# Patient Record
Sex: Male | Born: 1965 | Hispanic: Yes | Marital: Single | Smoking: Current every day smoker
Health system: Southern US, Community
[De-identification: ages and names within clinical notes are randomized; demographics above are authoritative.]

---

## 2014-05-15 ENCOUNTER — Encounter (HOSPITAL_COMMUNITY): Payer: Self-pay | Admitting: Emergency Medicine

## 2014-05-15 ENCOUNTER — Emergency Department (HOSPITAL_COMMUNITY): Payer: Worker's Compensation

## 2014-05-15 ENCOUNTER — Emergency Department (HOSPITAL_COMMUNITY)
Admission: EM | Admit: 2014-05-15 | Discharge: 2014-05-15 | Disposition: A | Discharge status: Home / Self Care | Payer: Worker's Compensation | Attending: Emergency Medicine | Admitting: Emergency Medicine

## 2014-05-15 DIAGNOSIS — S61209A Unspecified open wound of unspecified finger without damage to nail, initial encounter: Secondary | ICD-10-CM | POA: Insufficient documentation

## 2014-05-15 DIAGNOSIS — S6990XA Unspecified injury of unspecified wrist, hand and finger(s), initial encounter: Secondary | ICD-10-CM | POA: Insufficient documentation

## 2014-05-15 DIAGNOSIS — F17200 Nicotine dependence, unspecified, uncomplicated: Secondary | ICD-10-CM | POA: Diagnosis not present

## 2014-05-15 DIAGNOSIS — S6980XA Other specified injuries of unspecified wrist, hand and finger(s), initial encounter: Secondary | ICD-10-CM | POA: Diagnosis present

## 2014-05-15 DIAGNOSIS — Y990 Civilian activity done for income or pay: Secondary | ICD-10-CM | POA: Diagnosis not present

## 2014-05-15 DIAGNOSIS — Y9389 Activity, other specified: Secondary | ICD-10-CM | POA: Insufficient documentation

## 2014-05-15 DIAGNOSIS — Y9289 Other specified places as the place of occurrence of the external cause: Secondary | ICD-10-CM | POA: Diagnosis not present

## 2014-05-15 DIAGNOSIS — S61219A Laceration without foreign body of unspecified finger without damage to nail, initial encounter: Secondary | ICD-10-CM

## 2014-05-15 DIAGNOSIS — W268XXA Contact with other sharp object(s), not elsewhere classified, initial encounter: Secondary | ICD-10-CM | POA: Diagnosis not present

## 2014-05-15 MED ORDER — LIDOCAINE HCL (PF) 1 % IJ SOLN
10.0000 mL | Freq: Once | INTRAMUSCULAR | Status: AC
  Administered 2014-05-15: 10 mL via INTRADERMAL
  Filled 2014-05-15: qty 10

## 2014-05-15 MED ORDER — TETANUS-DIPHTH-ACELL PERTUSSIS 5-2.5-18.5 LF-MCG/0.5 IM SUSP
0.5000 mL | Freq: Once | INTRAMUSCULAR | Status: AC
Start: 2014-05-15 — End: 2014-05-15
  Administered 2014-05-15: 0.5 mL via INTRAMUSCULAR
  Filled 2014-05-15: qty 0.5

## 2014-05-15 MED ORDER — CEPHALEXIN 500 MG PO CAPS: 500.0000 mg | ORAL_CAPSULE | Freq: Two times a day (BID) | ORAL | Status: AC

## 2014-05-15 MED ORDER — HYDROCODONE-ACETAMINOPHEN 5-325 MG PO TABS: 1.0000 | ORAL_TABLET | Freq: Four times a day (QID) | ORAL | Status: AC | PRN

## 2014-05-15 MED ORDER — OXYCODONE-ACETAMINOPHEN 5-325 MG PO TABS
1.0000 | ORAL_TABLET | Freq: Once | ORAL | Status: AC
  Administered 2014-05-15: 1 via ORAL
  Filled 2014-05-15: qty 1

## 2014-05-15 MED ORDER — IBUPROFEN 600 MG PO TABS: 600.0000 mg | ORAL_TABLET | Freq: Four times a day (QID) | ORAL | Status: AC | PRN

## 2014-05-15 NOTE — ED Notes (Signed)
Cu t 3 fingers left hand w/ saw blade bleeding controlled at this time

## 2014-05-15 NOTE — ED Provider Notes (Signed)
Medical screening examination/treatment/procedure(s) were performed by non-physician practitioner and as supervising physician I was immediately available for consultation/collaboration.  Audree Camel, MD 05/15/14 [id number]

## 2014-05-15 NOTE — ED Notes (Signed)
Instructions given to pt. And pt.s boss.  Pt.verbalized understanding of instructions. They were printed in Spanish

## 2014-05-15 NOTE — ED Provider Notes (Signed)
CSN: [id number]     Arrival date & time 05/15/14  0848 History  This chart was scribed for non-physician practitioner, Jaynie Crumble, PA-C working with Audree Camel, MD by Greggory Stallion, ED scribe. This patient was seen in room TR06C/TR06C and the patient's care was started at 9:36 AM.    Chief Complaint  Patient presents with  . Hand Pain   The history is provided by the patient. No language interpreter was used.   HPI Comments: Timothy Espinoza is a 48 y.o. male who presents to the Emergency Department complaining of left hand laceration that occurred about one hour ago. Pt states he accidentally cut his index, middle and ring fingers with a saw blade at work. 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 Pt's last tetanus was 8 years ago.   History reviewed. No pertinent past medical history. History reviewed. No pertinent past surgical history. No family history on file. History  Substance Use Topics  . Smoking status: Current Every Day Smoker  . Smokeless tobacco: Not on file  . Alcohol Use: Yes    Review of Systems  Skin: Positive for wound.  All other systems reviewed and are negative.  Allergies  Review of patient's allergies indicates no known allergies.  Home Medications   Prior to Admission medications   Not on File   BP 124/80  Pulse 52  Temp(Src) 97.8 F (36.6 C)  Resp 16  SpO2 97%  Physical Exam  Nursing note and vitals reviewed. Constitutional: He is oriented to person, place, and time. He appears well-developed and well-nourished. No distress.  HENT:  Head: Normocephalic and atraumatic.  Eyes: Conjunctivae and EOM are normal.  Neck: Neck supple. No tracheal deviation present.  Cardiovascular: Normal rate.   Pulmonary/Chest: Effort normal. No respiratory distress.  Musculoskeletal: Normal range of motion.  Full range of motion of all fingers at every joint, strength is intact with flexion and extension at every joint against resistance. Cap  refill is in 2 seconds distally in all fingertips. Sensation is intact in all finger tips.  Neurological: He is alert and oriented to person, place, and time.  Skin: Skin is warm and dry.  Lacerations to the fingertips of the second, third, fourth fingers of the left hand. Laceration to the second and fourth fingers both measuring 2 cm, non-gaping. Laceration to the third finger is a flap laceration through the pad of the distal phalanx, hemostatic, measuring 3 cm  Psychiatric: He has a normal mood and affect. His behavior is normal.    ED Course  Procedures (including critical care time)  LACERATION REPAIR PROCEDURE NOTE The patient's identification was confirmed and consent was obtained. This procedure was performed by Jaynie Crumble, PA-C at 10:36 AM. Site: left index finger Sterile procedures observed Anesthetic used (type and amt): 1.5 mL 1% lidocaine without epi Suture type/size: 5-0 Prolene Length: 1 cm # of Sutures: 4 Technique: simple interrupted Complexity: simple Antibx ointment applied Tetanus ordered Site anesthetized, irrigated with NS, explored without evidence of foreign body, wound well approximated, site covered with dry, sterile dressing.  Patient tolerated procedure well without complications. Instructions for care discussed verbally and patient provided with additional written instructions for homecare and f/u.  LACERATION REPAIR PROCEDURE NOTE The patient's identification was confirmed and consent was obtained. This procedure was performed by Jaynie Crumble, PA-C at 10:36 AM. Site: left middle finger Sterile procedures observed Anesthetic used (type and amt): 2 mL 1% lidocaine without epi Suture type/size: 5-0 Prolene Length:  3 cm # of Sutures:7 Technique: simple interrupted Complexity: simple Antibx ointment applied Tetanus ordered Site anesthetized, irrigated with NS, explored without evidence of foreign body, wound well approximated, site  covered with dry, sterile dressing.  Patient tolerated procedure well without complications. Instructions for care discussed verbally and patient provided with additional written instructions for homecare and f/u.  LACERATION REPAIR PROCEDURE NOTE The patient's identification was confirmed and consent was obtained. This procedure was performed by Jaynie Crumble, PA-C at 10:36 AM. Site: left ring finger Sterile procedures observed Anesthetic used (type and amt): 1.5 mL 1% lidocaine without epi Suture type/size: 5-0 Prolene Length: 1 cm # of Sutures: 2 Technique: simple interrupted Complexity: simple Antibx ointment applied Tetanus ordered Site anesthetized, irrigated with NS, explored without evidence of foreign body, wound well approximated, site covered with dry, sterile dressing.  Patient tolerated procedure well without complications. Instructions for care discussed verbally and patient provided with additional written instructions for homecare and f/u.   DIAGNOSTIC STUDIES: Oxygen Saturation is 97% on RA, normal by my interpretation.    COORDINATION OF CARE: 9:38 AM-Advised pt of xray results. Discussed treatment plan which includes updating tetanus and laceration repair with pt at bedside and pt agreed to plan.   Labs Review Labs Reviewed - No data to display  Imaging Review Dg Hand Complete Left  05/15/2014   CLINICAL DATA:  Saw injury.  EXAM: LEFT HAND - COMPLETE 3+ VIEW  COMPARISON:  None.  FINDINGS: Limited exam due to overlying bandage. Soft tissue injury noted along the distal aspects of the second ,third ,and fourth digits. No radiopaque foreign bodies. No evidence of acute fracture or dislocation. Old clavicular fracture is noted. Mild sclerosis noted of the fragment along the medial aspect. This could be secondary to avascular necrosis.  IMPRESSION: 1. Soft tissue injury second ,third and, fourth distal tuft. No underlying acute bony abnormality. 2. 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   Electronically Signed   By: Maisie Fus  Register   On: 05/15/2014 10:17     EKG Interpretation None      MDM   Final diagnoses:  Laceration of fingers without complication, initial encounter    Patient with injury to the distal fingertips of fingers 2 through 4 with a soft. X-ray obtained and is negative. Laceration cleaned thoroughly and repaired with sutures. Will discharge home on Keflex, pain medication, followup in 7 days for suture removal. No evidence of tendon or nerve or vascular injury based on the exam.  Filed Vitals:   05/15/14 0858  BP: 124/80  Pulse: 52  Temp: 97.8 F (36.6 C)  Resp: 16  SpO2: 97%     I personally performed the services described in this documentation, which was scribed in my presence. The recorded information has been reviewed and is accurate.  Lottie Mussel, PA-C 05/15/14 1637

## 2014-05-15 NOTE — Discharge Instructions (Signed)
Ibuprofen for pain. Norco for severe pain only. Take Keflex as prescribed to prevent infection. Keep fingers clean, apply topical antibiotic ointment twice a day. Followup in 7 days with either your Dr. or return here for suture removal.  Cuidados de una laceracin - Adultos  (Laceration Care, Adult)  Una herida cortante es un corte o lesin que atraviesa todas las capas de la piel y el tejido que se encuentra debajo de la piel.  TRATAMIENTO  Algunas laceraciones no requieren sutura. Algunas no deben cerrarse debido a que puede aumentar el riesgo de infeccin. Es importante que consulte al mdico lo antes posible despus de recibir una lesin para minimizar el riesgo de infeccin y aumentar la posibilidad de que se cierre con xito.  Cuando se cierra adecuadamente, podrn indicarle analgsicos, si los necesita. La herida debe limpiarse para combatir la infeccin. El mdico usar puntos (suturas), [place], o tiras [place] para Environmental consultant. Estos elementos mantendrn unidos los bordes de la piel para que se cure ms rpidamente y para un mejor resultado cosmtico. Sin embargo, todas las heridas se curarn con una cicatriz. Una vez que la herida se haya curado, las cicatrices pueden minimizarse cubriendo la herida con pantalla solar durante el da por un lapso se 1 ao.  INSTRUCCIONES PARA EL CUIDADO EN EL HOGAR  Si tiene puntos o grapas:   Mantenga la herida limpia y [place].  Si tiene un (vendaje) cmbielo al menos una vez al da. Cmbielo si se moja o se ensucia, o segn las indicaciones del mdico.  Lave el corte dos veces por da con agua y [place]. Enjuguelo con agua para quitar todo el [place]. Seque dando palmaditas con una toalla limpia y seca.  Despus de limpiar, aplique una delgada capa de una crema con antibitico segn las indicaciones del mdico. Esto le ayudar a prevenir las infecciones y a Automotive engineer que el vendaje se Building services engineer.  Puede ducharse despus de las primeras 24  horas. No remoje la herida en agua hasta que le hayan quitado los puntos.  Solo tome medicamentos que se pueden comprar sin receta o recetados para Chief Technology Officer, Dentist o fiebre, como le indica el mdico.  Concurra para que le retiren los puntos o las grapas cuando el mdico le indique. En caso que tenga tiras ZOXWRUEAV:   Mantenga la herida limpia y seca.  No deje que las tiras se mojen. Puede darse un bao cuidando de [employer] herida seca.  Si se moja, squela dando palmaditas con una toalla limpia.  Las tiras caern por s mismas. Puede recortar las tiras a medida que la herida se [place]. No quite las tiras que estn pegadas a la herida. Ellas se caern cuando sea el momento. En caso que le hayan [place].   Podr mojara momentneamente la herida en la ducha o el bao. No frote ni sumerja la herida. No practique natacin. Evite transpirar con abundancia hasta que el [place] se haya cado. Despus de ducharse o darse un bao, seque el corte dando palmaditas con una toalla limpia.  No aplique medicamentos lquidos, en crema o ungentos mientras el [employer] est en su lugar. Podr aflojarlo antes de que la herida se cure.  Si tiene un vendaje, tenga cuidado de no aplicar cinta adhesiva directamente [employer]. Esto puede hacer que el [place] se caiga antes de que la herida se haya curado.  Evite la exposicin prolongada a la luz del sol o a la International aid/development worker en [place] se encuentre  en el lugar. La exposicin a los Hydrographic surveyor la Training and development officer.  El [employer] la piel durante 5 a 10 das y Therapist, occupational. No quite la pelcula de [place]. Deber aplicarse la vacuna contra el ttanos si:  No recuerda cundo se coloc la vacuna la ltima vez.  Nunca recibi esta vacuna. Si le han aplicado la vacuna contra el ttanos, el brazo podr hincharse, enrojecer y sentirse caliente al tacto. Esto es frecuente y no  es un problema. Si usted necesita aplicarse la vacuna y se niega a recibirla, corre riesgo de contraer ttanos. sta es una enfermedad grave.  SOLICITE ATENCIN MDICA SI:   Presenta enrojecimiento, hinchazn o aumento del dolor en la herida.  Hay rayas rojas que salen de la herida.  Observa un lquido blanco amarillento (pus) en la herida.  Tiene fiebre.  Advierte un olor ftido que proviene de la herida o del vendaje.  La herida se abre luego de que le han extrado las suturas.  Nota que en la herida hay algn cuerpo extrao como un trozo de [place] o vidrio.  La herida est en su mano o pie y observa que no puede mover correctamente los dedos. SOLICITE ATENCIN MDICA DE INMEDIATO SI:   El dolor no se alivia con los [employer].  Hay una zona muy hinchada alrededor de la herida que le causa dolor y adormecimiento, o advierte un cambio en el color en el brazo, la mano, la pierna o el pie.  La herida se abre y sangra nuevamente.  Siente que el adormecimiento, la debilidad o la prdida de la funcin de la articulacin que rodea la herida [place].  Palpa ndulos dolorosos cerca de la herida o bajo la piel en cualquier zona del cuerpo. ASEGRESE DE QUE:   Comprende estas instrucciones.  Controlar su enfermedad.  Solicitar ayuda de inmediato si no mejora o si empeora. Document Released: 08/11/2005 Document Revised: 11/03/2011 [facility] Patient Information 2015 [place], [place]. This information is not intended to replace advice given to you by your health care provider. Make sure you discuss any questions you have with your health care provider.

## 2015-10-24 IMAGING — CR DG HAND COMPLETE 3+V*L*
4 series · 4 of 4 positions shown · non-contrast
Comparison: None.

CLINICAL DATA: Saw injury.

EXAM:
LEFT HAND - COMPLETE 3+ VIEW

[x hand pa left]
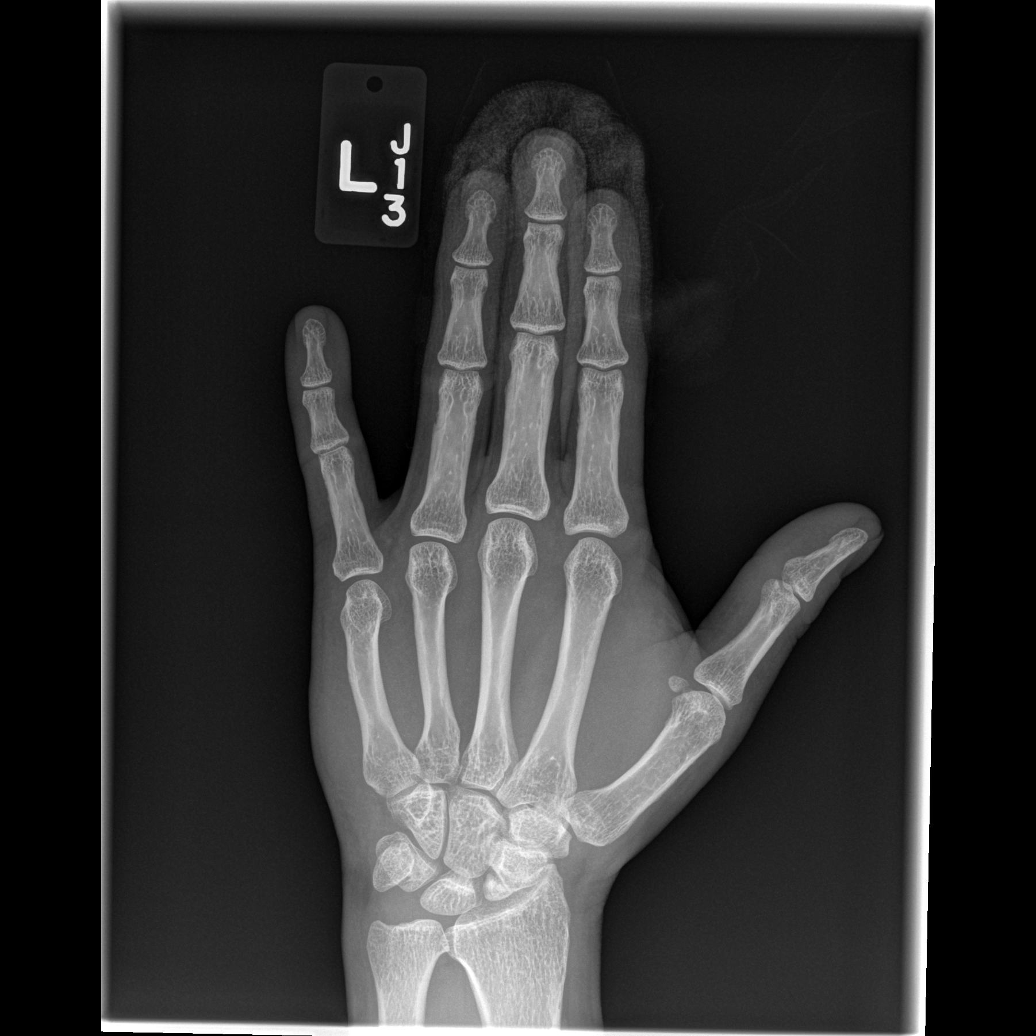

[x hand oblique left]
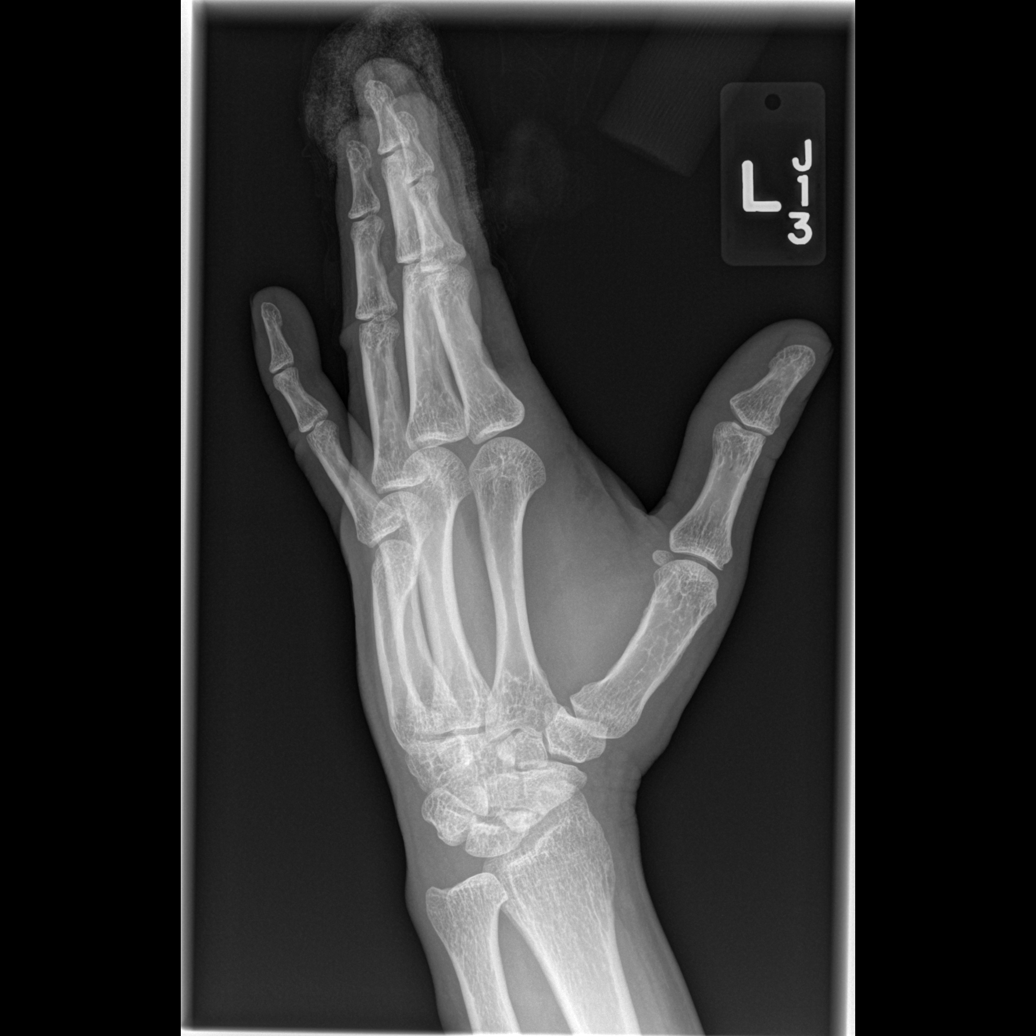

[x hand lat left (1 of 2)]
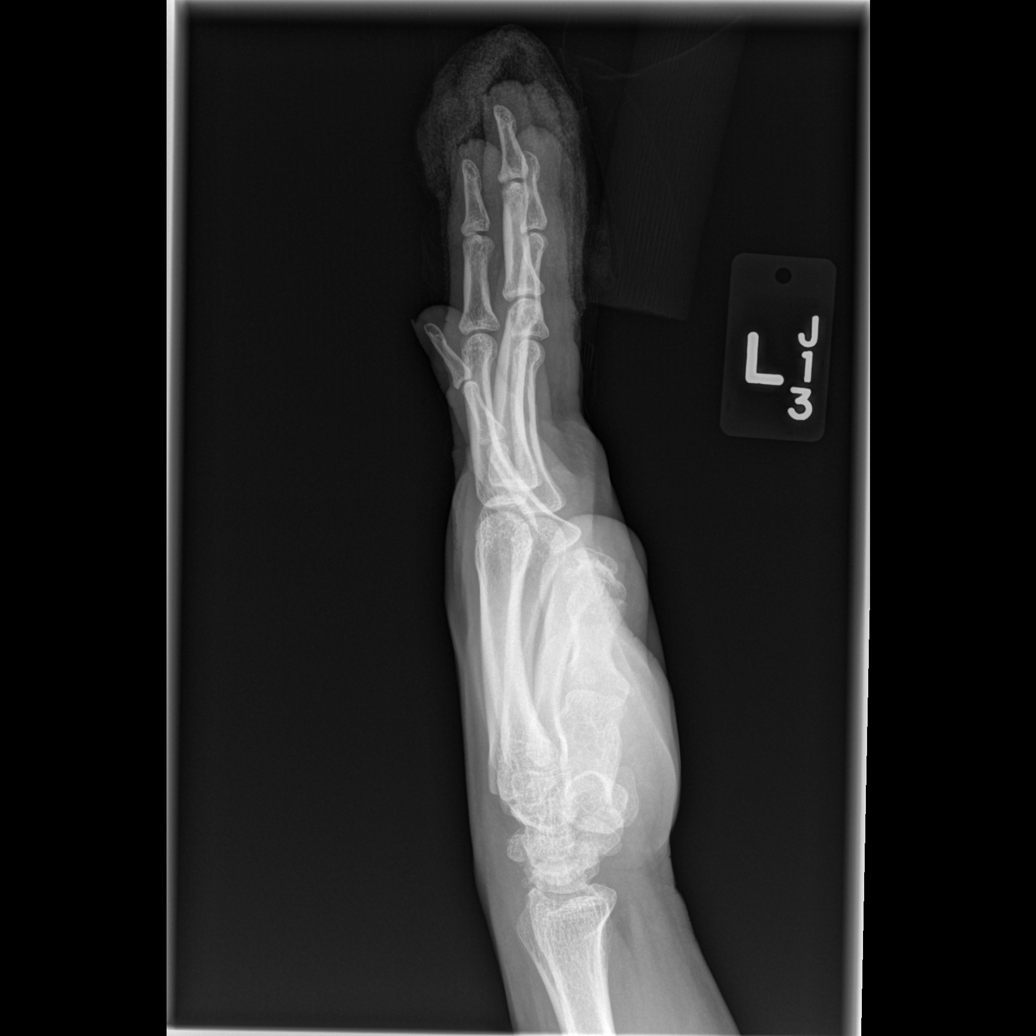

[x hand lat left (2 of 2)]
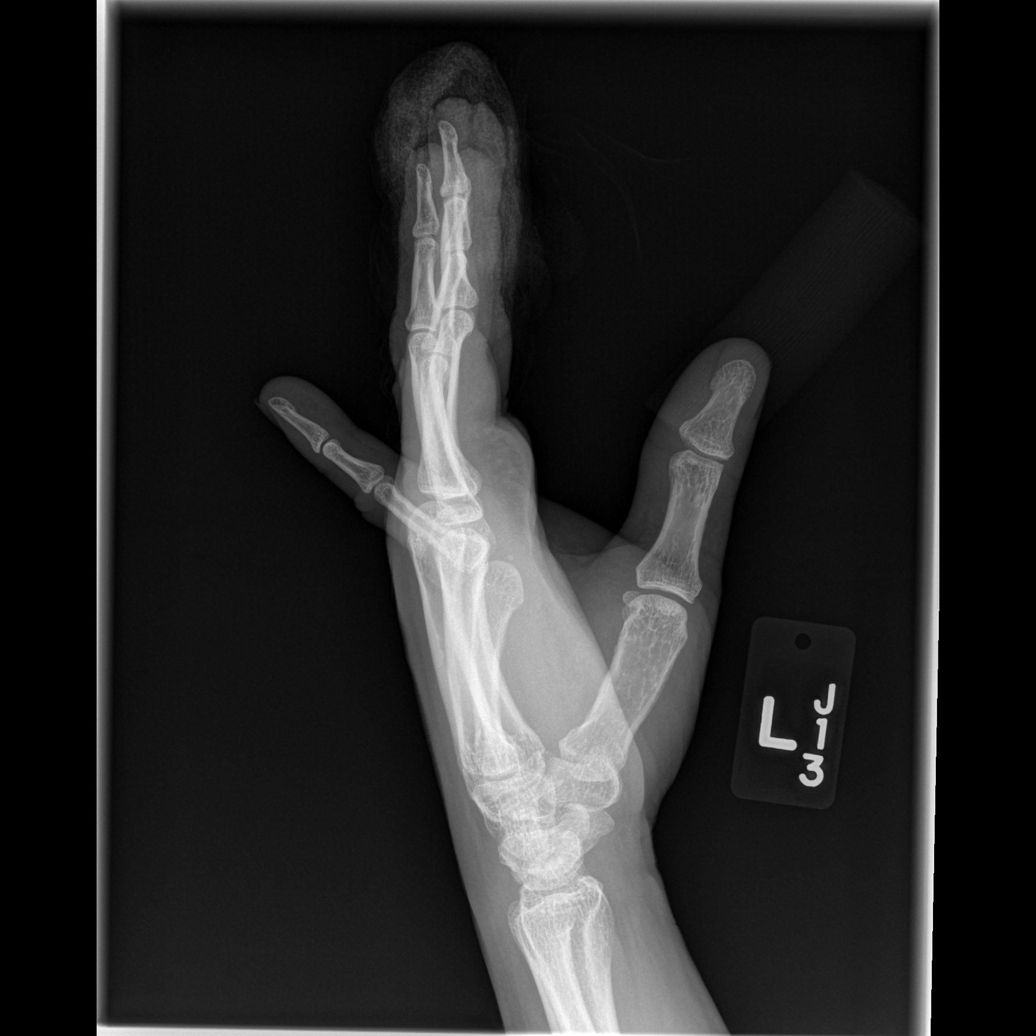

[4 of 4 positions shown; findings below may reference images not displayed]

FINDINGS: Limited exam due to overlying bandage. Soft tissue injury noted
along the distal aspects of the second ,third ,and fourth digits. No
radiopaque foreign bodies. No evidence of acute fracture or
dislocation. Old clavicular fracture is noted. Mild sclerosis noted
of the fragment along the medial aspect. This could be secondary to
avascular necrosis.
IMPRESSION: 1. Soft tissue injury [DATE] and, fourth distal tuft. No
underlying acute bony abnormality.
2. Old navicular fracture with probable avascular necrosis of the
medial fragment.
